# Patient Record
Sex: Male | Born: 1950 | Race: White | Hispanic: No | Marital: Single | Smoking: Current every day smoker
Health system: Southern US, Community
[De-identification: ages and names within clinical notes are randomized; demographics above are authoritative.]

## PROBLEM LIST (undated history)

## (undated) DIAGNOSIS — E785 Hyperlipidemia, unspecified: Secondary | ICD-10-CM

## (undated) DIAGNOSIS — F419 Anxiety disorder, unspecified: Secondary | ICD-10-CM

## (undated) HISTORY — PX: COLONOSCOPY W/ POLYPECTOMY: SHX1380

## (undated) HISTORY — DX: Anxiety disorder, unspecified: F41.9

## (undated) HISTORY — DX: Hyperlipidemia, unspecified: E78.5

## (undated) HISTORY — PX: WISDOM TOOTH EXTRACTION: SHX21

---

## 2008-06-22 ENCOUNTER — Emergency Department (HOSPITAL_COMMUNITY): Admission: EM | Admit: 2008-06-22 | Discharge: 2008-06-22 | Payer: Self-pay | Admitting: Emergency Medicine

## 2008-07-06 ENCOUNTER — Encounter: Admission: RE | Admit: 2008-07-06 | Discharge: 2008-07-06 | Payer: Self-pay | Admitting: Internal Medicine

## 2009-03-01 ENCOUNTER — Encounter (INDEPENDENT_AMBULATORY_CARE_PROVIDER_SITE_OTHER): Payer: Self-pay | Admitting: *Deleted

## 2009-03-29 ENCOUNTER — Encounter (INDEPENDENT_AMBULATORY_CARE_PROVIDER_SITE_OTHER): Payer: Self-pay | Admitting: *Deleted

## 2009-03-31 ENCOUNTER — Ambulatory Visit: Payer: Self-pay | Admitting: Gastroenterology

## 2009-04-14 ENCOUNTER — Ambulatory Visit: Payer: Self-pay | Admitting: Gastroenterology

## 2009-04-19 ENCOUNTER — Encounter: Payer: Self-pay | Admitting: Gastroenterology

## 2009-05-20 ENCOUNTER — Encounter: Admission: RE | Admit: 2009-05-20 | Discharge: 2009-05-20 | Payer: Self-pay | Admitting: Internal Medicine

## 2010-02-27 ENCOUNTER — Encounter: Payer: Self-pay | Admitting: Internal Medicine

## 2010-03-07 NOTE — Letter (Signed)
Summary: Patient Notice-Hyperplastic Polyps  [facility]  [address] [place], [place] [address]   Phone: [phone]  Fax: [phone]        April 19, 2009 MRN: [id number]    THELTON GRACA [address] [place], [place]  [address]    Dear Mr. SHULAR,  I am pleased to inform you that the colon polyp(s) removed during your recent colonoscopy was (were) found to be hyperplastic. These types of polyps are NOT pre-cancerous.  It is my recommendation that you have a repeat colonoscopy examination in 10 years for routine colorectal cancer screening.  Should you develop new or worsening symptoms of abdominal pain, bowel habit changes or bleeding from the rectum or bowels, please schedule an evaluation with either your primary care physician or with me.  Continue treatment plan as outlined the day of your exam.  Please call us if you are having persistent problems or have questions about your condition that have not been fully answered at this time.  Sincerely,  Meryl Dare MD [facility]  This letter has been electronically signed by your physician.  Appended Document: Patient Notice-Hyperplastic Polyps Letter mailed 3.18.11

## 2010-03-07 NOTE — Letter (Signed)
Summary: Previsit letter  [facility]  [address] [place], [place] [address]   Phone: [phone]  Fax: [phone]       03/01/2009 MRN: [id number]  Noah Allison [address] [place], [place]  [address]  Dear Mr. MORY,  Welcome to the Gastroenterology Division at [facility].    You are scheduled to see a nurse for your pre-procedure visit on March 31, 2009 at 8:30am on the 3rd floor at [facility], [address]  We ask that you try to arrive at our office 15 minutes prior to your appointment time to allow for check-in.  Your nurse visit will consist of discussing your medical and surgical history, your immediate family medical history, and your medications.    Please bring a complete list of all your medications or, if you prefer, bring the medication bottles and we will list them.  We will need to be aware of both prescribed and over the counter drugs.  We will need to know exact dosage information as well.  If you are on blood thinners (Coumadin, Plavix, Aggrenox, Ticlid, etc.) please call our office today/prior to your appointment, as we need to consult with your physician about holding your medication.   Please be prepared to read and sign documents such as consent forms, a financial agreement, and acknowledgement forms.  If necessary, and with your consent, a friend or relative is welcome to sit-in on the nurse visit with you.  Please bring your insurance card so that we may make a copy of it.  If your insurance requires a referral to see a specialist, please bring your referral form from your primary care physician.  No co-pay is required for this nurse visit.     If you cannot keep your appointment, please call [phone] to cancel or reschedule prior to your appointment date.  This allows [place] the opportunity to schedule an appointment for another patient in need of care.    Thank you for choosing [facility] for your  medical needs.  We appreciate the opportunity to care for you.  Please visit [place] at our website  to learn more about our practice.                     Sincerely.                                                                                                                   The Gastroenterology Division

## 2010-03-07 NOTE — Letter (Signed)
Summary: [facility] Instructions  [facility]  [address] [place], [place] [address]   Phone: [phone]  Fax: [phone]       Noah Allison    1950/09/06    MRN: [id number]        Procedure Day /Date:  Thursday  04/14/09     Arrival Time:  7:30am     Procedure Time:  8:30am     Location of Procedure:                    _X _  [facility] (4th Floor)                        PREPARATION FOR COLONOSCOPY WITH MOVIPREP   Starting 5 days prior to your procedure   Saturday 03/05   do not eat nuts, seeds, popcorn, corn, beans, peas,  salads, or any raw vegetables.  Do not take any fiber supplements (e.g. Metamucil, Citrucel, and Benefiber).  THE DAY BEFORE YOUR PROCEDURE         DATE:   03/09  DAY:   Wednesday  1.  Drink clear liquids the entire day-NO SOLID FOOD  2.  Do not drink anything colored red or purple.  Avoid juices with pulp.  No orange juice.  3.  Drink at least 64 oz. (8 glasses) of fluid/clear liquids during the day to prevent dehydration and help the prep work efficiently.  CLEAR LIQUIDS INCLUDE: Water Jello Ice Popsicles Tea (sugar ok, no milk/cream) Powdered fruit flavored drinks Coffee (sugar ok, no milk/cream) Gatorade Juice: apple, white grape, white cranberry  Lemonade Clear bullion, consomm, broth Carbonated beverages (any kind) Strained chicken noodle soup Hard Candy                             4.  In the morning, mix first dose of MoviPrep solution:    Empty 1 Pouch A and 1 Pouch B into the disposable container    Add lukewarm drinking water to the top line of the container. Mix to dissolve    Refrigerate (mixed solution should be used within 24 hrs)  5.  Begin drinking the prep at 5:00 p.m. The MoviPrep container is divided by 4 marks.   Every 15 minutes drink the solution down to the next mark (approximately 8 oz) until the full liter is complete.   6.  Follow completed prep with 16 oz of clear liquid of  your choice (Nothing red or purple).  Continue to drink clear liquids until bedtime.  7.  Before going to bed, mix second dose of MoviPrep solution:    Empty 1 Pouch A and 1 Pouch B into the disposable container    Add lukewarm drinking water to the top line of the container. Mix to dissolve    Refrigerate  THE DAY OF YOUR PROCEDURE      DATE:   03/10  DAY:  Thursday  Beginning at  3:30 a.m. (5 hours before procedure):         1. Every 15 minutes, drink the solution down to the next mark (approx 8 oz) until the full liter is complete.  2. Follow completed prep with 16 oz. of clear liquid of your choice.    3. You may drink clear liquids until 6:30am  (2 HOURS BEFORE PROCEDURE).   MEDICATION INSTRUCTIONS  Unless otherwise instructed, you should take regular prescription  medications with a small sip of water   as early as possible the morning of your procedure.         OTHER INSTRUCTIONS  You will need a responsible adult at least 58 years of age to accompany you and drive you home.   This person must remain in the waiting room during your procedure.  Wear loose fitting clothing that is easily removed.  Leave jewelry and other valuables at home.  However, you may wish to bring a book to read or  an iPod/MP3 player to listen to music as you wait for your procedure to start.  Remove all body piercing jewelry and leave at home.  Total time from sign-in until discharge is approximately 2-3 hours.  You should go home directly after your procedure and rest.  You can resume normal activities the  day after your procedure.  The day of your procedure you should not:   Drive   Make legal decisions   Operate machinery   Drink alcohol   Return to work  You will receive specific instructions about eating, activities and medications before you leave.    The above instructions have been reviewed and explained to me by   Wyona Almas RN  March 31, 2009 8:50  AM     I fully understand and can verbalize these instructions _____________________________ Date _________

## 2010-03-07 NOTE — Miscellaneous (Signed)
Summary: [facility] Previsit/prep  Clinical Lists Changes  Medications: Added new medication of MOVIPREP 100 GM  SOLR (PEG-KCL-NACL-NASULF-NA ASC-C) As per prep instructions. - Signed Rx of MOVIPREP 100 GM  SOLR (PEG-KCL-NACL-NASULF-NA ASC-C) As per prep instructions.;  #1 x 0;  Signed;  Entered by: Wyona Almas RN;  Authorized by: Meryl Dare MD [facility];  Method used: Electronically to [facility] [id number]*, [address], [place], [place]  [address], Ph: [phone] or [phone], Fax: [phone] Observations: Added new observation of NKA: T (03/31/2009 8:09)    Prescriptions: MOVIPREP 100 GM  SOLR (PEG-KCL-NACL-NASULF-NA ASC-C) As per prep instructions.  #1 x 0   Entered by:   Wyona Almas RN   Authorized by:   Meryl Dare MD [facility]   Signed by:   Wyona Almas RN on 03/31/2009   Method used:   Electronically to        [facility] [phone]* (retail)       [address]       [place], [place]  [address]       Ph: [phone] or [phone]       Fax: [phone]   RxID:   [id number]

## 2010-03-07 NOTE — Procedures (Signed)
Summary: Colonoscopy  Patient: Noah Allison Note: All result statuses are Final unless otherwise noted.  Tests: (1) Colonoscopy (COL)   COL Colonoscopy           DONE     [facility]     [address]     [place], [place]  [address]           COLONOSCOPY PROCEDURE REPORT           PATIENT:  Noah Allison, Noah Allison  MR#:  [id number]     BIRTHDATE:  Jun 06, 1950, 58 yrs. old  GENDER:  male           ENDOSCOPIST:  Judie Petit T. Russella Dar, MD, [facility]     Referred by:  Pearson Grippe, M.D.           PROCEDURE DATE:  04/14/2009     PROCEDURE:  Colonoscopy with snare polypectomy     ASA CLASS:  Class II     INDICATIONS:  1) Routine Risk Screening           MEDICATIONS:   Fentanyl 75 mcg IV, Versed 7 mg IV           DESCRIPTION OF PROCEDURE:   After the risks benefits and     alternatives of the procedure were thoroughly explained, informed     consent was obtained.  Digital rectal exam was performed and     revealed no abnormalities.   The LB [id number] endoscope     was introduced through the anus and advanced to the cecum, which     was identified by both the appendix and ileocecal valve, without     limitations.  The quality of the prep was excellent, using     MoviPrep.  The instrument was then slowly withdrawn as the colon     was fully examined.     <<PROCEDUREIMAGES>>           FINDINGS:  A pedunculated polyp was found in the descending colon.     It was 7 mm in size. Polyp was snared, then cauterized with     monopolar cautery. Retrieval was successful. A sessile polyp was     found in the rectum. It was 4 mm in size. The polyp was removed     using cold biopsy forceps.  Moderate diverticulosis was found in     the sigmoid colon. This was otherwise a normal examination of the     colon. Retroflexed views in the rectum revealed internal     hemorrhoids, moderate. The time to cecum =  5.67  minutes. The     scope was then withdrawn (time =  8.67  min) from the patient and  the procedure completed.           COMPLICATIONS:  None           ENDOSCOPIC IMPRESSION:     1) 7 mm pedunculated polyp in the descending colon     2) 4 mm sessile polyp in the rectum     3) Moderate diverticulosis in the sigmoid colon     4) Internal hemorrhoids           RECOMMENDATIONS:     1) Await pathology results     2) No aspirin or NSAID's for 2 weeks     3) High fiber diet with liberal fluid intake.     4) If the polyps removed today are adenomatous (pre-cancerous),     you  will need a repeat colonoscopy in 5 years. Otherwise you     should continue to follow colorectal cancer screening guidelines     for "routine risk" patients with colonoscopy in 10 years.     Venita Lick. Russella Dar, MD, [facility]           n.     eSIGNED:   Venita Lick. Jacoria Keiffer at 04/14/2009 09:01 AM           Regan Rakers, [id number]  Note: An exclamation mark (!) indicates a result that was not dispersed into the flowsheet. Document Creation Date: 04/14/2009 9:01 AM _______________________________________________________________________  (1) Order result status: Final Collection or observation date-time: 04/14/2009 08:56 Requested date-time:  Receipt date-time:  Reported date-time:  Referring Physician:   Ordering Physician: Claudette Head [phone]) Specimen Source:  Source: Launa Grill Order Number: [id number] Lab site:   Appended Document: Colonoscopy     Procedures Next Due Date:    Colonoscopy: 04/2019

## 2010-08-01 ENCOUNTER — Other Ambulatory Visit: Payer: Self-pay | Admitting: Internal Medicine

## 2010-08-01 DIAGNOSIS — I6529 Occlusion and stenosis of unspecified carotid artery: Secondary | ICD-10-CM

## 2010-08-04 ENCOUNTER — Ambulatory Visit
Admission: RE | Admit: 2010-08-04 | Discharge: 2010-08-04 | Disposition: A | Discharge status: Home / Self Care | Payer: BC Managed Care – PPO | Source: Ambulatory Visit | Attending: Internal Medicine | Admitting: Internal Medicine

## 2010-08-04 DIAGNOSIS — I6529 Occlusion and stenosis of unspecified carotid artery: Secondary | ICD-10-CM

## 2013-05-25 ENCOUNTER — Other Ambulatory Visit: Payer: Self-pay | Admitting: Dermatology

## 2014-06-16 ENCOUNTER — Encounter: Payer: Self-pay | Admitting: Gastroenterology

## 2014-08-12 ENCOUNTER — Ambulatory Visit (AMBULATORY_SURGERY_CENTER): Payer: Self-pay

## 2014-08-12 VITALS — Ht 67.5 in | Wt 152.6 lb

## 2014-08-12 DIAGNOSIS — Z860100 Personal history of colon polyps, unspecified: Secondary | ICD-10-CM

## 2014-08-12 MED ORDER — SUPREP BOWEL PREP KIT 17.5-3.13-1.6 GM/177ML PO SOLN: 1.0000 | Freq: Once | ORAL | Status: DC

## 2014-08-12 NOTE — Progress Notes (Signed)
No allergies to eggs or soy No diet/weight loss meds No past problems with anesthesia No home oxygen  Does not want emmi instructions; has had procedure before.

## 2014-08-20 ENCOUNTER — Encounter: Payer: Self-pay | Admitting: Gastroenterology

## 2014-08-20 ENCOUNTER — Ambulatory Visit (AMBULATORY_SURGERY_CENTER): Payer: 59 | Admitting: Gastroenterology

## 2014-08-20 VITALS — BP 112/72 | HR 55 | Temp 97.1°F | Resp 22 | Ht 67.5 in | Wt 152.0 lb

## 2014-08-20 DIAGNOSIS — Z1211 Encounter for screening for malignant neoplasm of colon: Secondary | ICD-10-CM

## 2014-08-20 DIAGNOSIS — Z800 Family history of malignant neoplasm of digestive organs: Secondary | ICD-10-CM | POA: Diagnosis present

## 2014-08-20 MED ORDER — SODIUM CHLORIDE 0.9 % IV SOLN: 500.0000 mL | INTRAVENOUS | Status: DC

## 2014-08-20 NOTE — Patient Instructions (Signed)
YOU HAD AN ENDOSCOPIC PROCEDURE TODAY AT [facility]:   Refer to the procedure report that was given to you for any specific questions about what was found during the examination.  If the procedure report does not answer your questions, please call your gastroenterologist to clarify.  If you requested that your care partner not be given the details of your procedure findings, then the procedure report has been included in a sealed envelope for you to review at your convenience later.  YOU SHOULD EXPECT: Some feelings of bloating in the abdomen. Passage of more gas than usual.  Walking can help get rid of the air that was put into your GI tract during the procedure and reduce the bloating. If you had a lower endoscopy (such as a colonoscopy or flexible sigmoidoscopy) you may notice spotting of blood in your stool or on the toilet paper. If you underwent a bowel prep for your procedure, you may not have a normal bowel movement for a few days.  Please Note:  You might notice some irritation and congestion in your nose or some drainage.  This is from the oxygen used during your procedure.  There is no need for concern and it should clear up in a day or so.  SYMPTOMS TO REPORT IMMEDIATELY:   Following lower endoscopy (colonoscopy or flexible sigmoidoscopy):  Excessive amounts of blood in the stool  Significant tenderness or worsening of abdominal pains  Swelling of the abdomen that is new, acute  Fever of 100F or higher  For urgent or emergent issues, a gastroenterologist can be reached at any hour by calling [phone].   DIET: Your first meal following the procedure should be a small meal and then it is ok to progress to your normal diet. Heavy or fried foods are harder to digest and may make you feel nauseous or bloated.  Likewise, meals heavy in dairy and vegetables can increase bloating.  Drink plenty of fluids but you should avoid alcoholic beverages for 24  hours.  ACTIVITY:  You should plan to take it easy for the rest of today and you should NOT DRIVE or use heavy machinery until tomorrow (because of the sedation medicines used during the test).    FOLLOW UP: Our staff will call the number listed on your records the next business day following your procedure to check on you and address any questions or concerns that you may have regarding the information given to you following your procedure. If we do not reach you, we will leave a message.  However, if you are feeling well and you are not experiencing any problems, there is no need to return our call.  We will assume that you have returned to your regular daily activities without incident.  If any biopsies were taken you will be contacted by phone or by letter within the next 1-3 weeks.  Please call us at [phone] if you have not heard about the biopsies in 3 weeks.    SIGNATURES/CONFIDENTIALITY: You and/or your care partner have signed paperwork which will be entered into your electronic medical record.  These signatures attest to the fact that that the information above on your After Visit Summary has been reviewed and is understood.  Full responsibility of the confidentiality of this discharge information lies with you and/or your care-partner.  Diverticulosis and high fiber diet information given. Liberal fluid intake.  Follow-up colonoscopy 5 years.

## 2014-08-20 NOTE — Op Note (Addendum)
[place]  [address] [place], [address]   COLONOSCOPY PROCEDURE REPORT  PATIENT: Noah Allison, Noah Allison  MR#: [id number] BIRTHDATE: 07-14-1950 , 13  yrs. old GENDER: male ENDOSCOPIST: Ladene Artist, MD, [facility] REFERRED NI:OEVOJ Kim, M.D. PROCEDURE DATE:  08/20/2014 PROCEDURE:   Colonoscopy, screening First Screening Colonoscopy - Avg.  risk and is 50 yrs.  old or older - No.  Prior Negative Screening - Now for repeat screening. 10 or more years since last screening  History of Adenoma - Now for follow-up colonoscopy & has been > or = to 3 yrs.  N/A  Polyps removed today? No Recommend repeat exam, <10 yrs? No ASA CLASS:   Class II INDICATIONS:Screening for colonic neoplasia and Colorectal Neoplasm Risk Assessment for this procedure is high. 2 paternal uncles and PGF with colon cancer MEDICATIONS: Monitored anesthesia care and Propofol 200 mg IV DESCRIPTION OF PROCEDURE:   After the risks benefits and alternatives of the procedure were thoroughly explained, informed consent was obtained.  The digital rectal exam revealed no abnormalities of the rectum.   The LB [id number]  endoscope was introduced through the anus and advanced to the cecum, which was identified by both the appendix and ileocecal valve. No adverse events experienced.   The quality of the prep was excellent. (Suprep was used)  The instrument was then slowly withdrawn as the colon was fully examined. Estimated blood loss is zero unless otherwise noted in this procedure report.    COLON FINDINGS: There was moderate diverticulosis noted in the sigmoid colon with associated luminal narrowing, spasm and muscular hypertrophy.   The examination was otherwise normal.  Retroflexed views revealed internal Grade I hemorrhoids. The time to cecum = 8.7 Withdrawal time = 10.5   The scope was withdrawn and the procedure completed. COMPLICATIONS: There were no immediate complications.  ENDOSCOPIC  IMPRESSION: 1.   Moderate diverticulosis in the sigmoid colon 2.   Grade l internal hemorrhoids  RECOMMENDATIONS: 1.  High fiber diet with liberal fluid intake. 2.  Repeat colonoscopy in 5 years.  eSigned:  Ladene Artist, MD, [facility] 08/20/2014 11:38 AM Revised: 08/20/2014 11:38 AM [C

## 2014-08-20 NOTE — Progress Notes (Signed)
Report to PACU, RN, vss, BBS= Clear.  

## 2014-08-23 ENCOUNTER — Telehealth: Payer: Self-pay

## 2014-08-23 NOTE — Telephone Encounter (Signed)
  Follow up Call-  Call back number 08/20/2014  Post procedure Call Back phone  # [phone]  Permission to leave phone message No  comments no voicemail     Patient questions:  Do you have a fever, pain , or abdominal swelling? No. Pain Score  0 *  Have you tolerated food without any problems? Yes.    Have you been able to return to your normal activities? Yes.    Do you have any questions about your discharge instructions: Diet   No. Medications  No. Follow up visit  No.  Do you have questions or concerns about your Care? No.  Actions: * If pain score is 4 or above: No action needed, pain <4.  No problems per the pt. maw

## 2014-09-30 ENCOUNTER — Other Ambulatory Visit: Payer: Self-pay | Admitting: Physician Assistant

## 2014-10-28 ENCOUNTER — Encounter: Payer: Self-pay | Admitting: Gastroenterology

## 2015-06-08 ENCOUNTER — Other Ambulatory Visit: Payer: Self-pay | Admitting: Internal Medicine

## 2015-06-08 DIAGNOSIS — I6521 Occlusion and stenosis of right carotid artery: Secondary | ICD-10-CM

## 2015-11-09 ENCOUNTER — Other Ambulatory Visit: Payer: Self-pay

## 2015-11-10 ENCOUNTER — Ambulatory Visit
Admission: RE | Admit: 2015-11-10 | Discharge: 2015-11-10 | Disposition: A | Discharge status: Home / Self Care | Payer: BLUE CROSS/BLUE SHIELD | Source: Ambulatory Visit | Attending: Internal Medicine | Admitting: Internal Medicine

## 2015-11-10 DIAGNOSIS — I6521 Occlusion and stenosis of right carotid artery: Secondary | ICD-10-CM

## 2016-05-08 ENCOUNTER — Other Ambulatory Visit: Payer: Self-pay | Admitting: Internal Medicine

## 2016-05-08 DIAGNOSIS — R519 Headache, unspecified: Secondary | ICD-10-CM

## 2016-05-08 DIAGNOSIS — R51 Headache: Secondary | ICD-10-CM

## 2016-05-08 DIAGNOSIS — R05 Cough: Secondary | ICD-10-CM

## 2016-05-08 DIAGNOSIS — R059 Cough, unspecified: Secondary | ICD-10-CM

## 2016-05-08 DIAGNOSIS — F17200 Nicotine dependence, unspecified, uncomplicated: Secondary | ICD-10-CM

## 2016-05-09 ENCOUNTER — Other Ambulatory Visit: Payer: Self-pay | Admitting: Internal Medicine

## 2016-05-09 DIAGNOSIS — F17200 Nicotine dependence, unspecified, uncomplicated: Secondary | ICD-10-CM

## 2016-05-10 ENCOUNTER — Other Ambulatory Visit: Payer: BLUE CROSS/BLUE SHIELD

## 2016-05-11 ENCOUNTER — Ambulatory Visit
Admission: RE | Admit: 2016-05-11 | Discharge: 2016-05-11 | Disposition: A | Discharge status: Home / Self Care | Payer: BLUE CROSS/BLUE SHIELD | Source: Ambulatory Visit | Attending: Internal Medicine | Admitting: Internal Medicine

## 2016-05-11 DIAGNOSIS — R51 Headache: Principal | ICD-10-CM

## 2016-05-11 DIAGNOSIS — F17200 Nicotine dependence, unspecified, uncomplicated: Secondary | ICD-10-CM

## 2016-05-11 DIAGNOSIS — R519 Headache, unspecified: Secondary | ICD-10-CM

## 2018-07-01 DIAGNOSIS — E7800 Pure hypercholesterolemia, unspecified: Secondary | ICD-10-CM | POA: Diagnosis not present

## 2018-07-01 DIAGNOSIS — Z0000 Encounter for general adult medical examination without abnormal findings: Secondary | ICD-10-CM | POA: Diagnosis not present

## 2018-07-01 DIAGNOSIS — Z125 Encounter for screening for malignant neoplasm of prostate: Secondary | ICD-10-CM | POA: Diagnosis not present

## 2018-07-01 DIAGNOSIS — E039 Hypothyroidism, unspecified: Secondary | ICD-10-CM | POA: Diagnosis not present

## 2018-07-11 DIAGNOSIS — R05 Cough: Secondary | ICD-10-CM | POA: Diagnosis not present

## 2018-07-11 DIAGNOSIS — B079 Viral wart, unspecified: Secondary | ICD-10-CM | POA: Diagnosis not present

## 2018-07-11 DIAGNOSIS — E039 Hypothyroidism, unspecified: Secondary | ICD-10-CM | POA: Diagnosis not present

## 2018-07-11 DIAGNOSIS — I10 Essential (primary) hypertension: Secondary | ICD-10-CM | POA: Diagnosis not present

## 2018-07-11 DIAGNOSIS — M5412 Radiculopathy, cervical region: Secondary | ICD-10-CM | POA: Diagnosis not present

## 2018-07-11 DIAGNOSIS — I6529 Occlusion and stenosis of unspecified carotid artery: Secondary | ICD-10-CM | POA: Diagnosis not present

## 2018-07-11 DIAGNOSIS — Z125 Encounter for screening for malignant neoplasm of prostate: Secondary | ICD-10-CM | POA: Diagnosis not present

## 2018-07-11 DIAGNOSIS — E7800 Pure hypercholesterolemia, unspecified: Secondary | ICD-10-CM | POA: Diagnosis not present

## 2018-07-11 DIAGNOSIS — F17200 Nicotine dependence, unspecified, uncomplicated: Secondary | ICD-10-CM | POA: Diagnosis not present

## 2018-07-11 DIAGNOSIS — Z0000 Encounter for general adult medical examination without abnormal findings: Secondary | ICD-10-CM | POA: Diagnosis not present

## 2018-10-23 DIAGNOSIS — L821 Other seborrheic keratosis: Secondary | ICD-10-CM | POA: Diagnosis not present

## 2018-10-23 DIAGNOSIS — C44622 Squamous cell carcinoma of skin of right upper limb, including shoulder: Secondary | ICD-10-CM | POA: Diagnosis not present

## 2018-10-23 DIAGNOSIS — L84 Corns and callosities: Secondary | ICD-10-CM | POA: Diagnosis not present

## 2019-01-07 DIAGNOSIS — I10 Essential (primary) hypertension: Secondary | ICD-10-CM | POA: Diagnosis not present

## 2019-01-07 DIAGNOSIS — E7800 Pure hypercholesterolemia, unspecified: Secondary | ICD-10-CM | POA: Diagnosis not present

## 2019-01-14 DIAGNOSIS — Z122 Encounter for screening for malignant neoplasm of respiratory organs: Secondary | ICD-10-CM | POA: Diagnosis not present

## 2019-01-14 DIAGNOSIS — M5135 Other intervertebral disc degeneration, thoracolumbar region: Secondary | ICD-10-CM | POA: Diagnosis not present

## 2019-01-14 DIAGNOSIS — I6521 Occlusion and stenosis of right carotid artery: Secondary | ICD-10-CM | POA: Diagnosis not present

## 2019-01-14 DIAGNOSIS — F17200 Nicotine dependence, unspecified, uncomplicated: Secondary | ICD-10-CM | POA: Diagnosis not present

## 2019-01-14 DIAGNOSIS — M419 Scoliosis, unspecified: Secondary | ICD-10-CM | POA: Diagnosis not present

## 2019-01-14 DIAGNOSIS — Z125 Encounter for screening for malignant neoplasm of prostate: Secondary | ICD-10-CM | POA: Diagnosis not present

## 2019-03-02 ENCOUNTER — Other Ambulatory Visit: Payer: Self-pay | Admitting: Family Medicine

## 2019-03-02 DIAGNOSIS — Z122 Encounter for screening for malignant neoplasm of respiratory organs: Secondary | ICD-10-CM

## 2019-03-09 ENCOUNTER — Encounter (INDEPENDENT_AMBULATORY_CARE_PROVIDER_SITE_OTHER): Payer: Self-pay

## 2019-03-09 ENCOUNTER — Ambulatory Visit
Admission: RE | Admit: 2019-03-09 | Discharge: 2019-03-09 | Disposition: A | Discharge status: Home / Self Care | Payer: PPO | Source: Ambulatory Visit | Attending: Family Medicine | Admitting: Family Medicine

## 2019-03-09 DIAGNOSIS — F17210 Nicotine dependence, cigarettes, uncomplicated: Secondary | ICD-10-CM | POA: Diagnosis not present

## 2019-03-09 DIAGNOSIS — Z122 Encounter for screening for malignant neoplasm of respiratory organs: Secondary | ICD-10-CM

## 2019-07-07 DIAGNOSIS — I6521 Occlusion and stenosis of right carotid artery: Secondary | ICD-10-CM | POA: Diagnosis not present

## 2019-07-07 DIAGNOSIS — Z125 Encounter for screening for malignant neoplasm of prostate: Secondary | ICD-10-CM | POA: Diagnosis not present

## 2020-02-23 DIAGNOSIS — L821 Other seborrheic keratosis: Secondary | ICD-10-CM | POA: Diagnosis not present

## 2020-02-23 DIAGNOSIS — Z859 Personal history of malignant neoplasm, unspecified: Secondary | ICD-10-CM | POA: Diagnosis not present

## 2020-02-23 DIAGNOSIS — L905 Scar conditions and fibrosis of skin: Secondary | ICD-10-CM | POA: Diagnosis not present

## 2020-09-13 DIAGNOSIS — C44319 Basal cell carcinoma of skin of other parts of face: Secondary | ICD-10-CM | POA: Diagnosis not present

## 2020-09-13 DIAGNOSIS — L821 Other seborrheic keratosis: Secondary | ICD-10-CM | POA: Diagnosis not present

## 2020-09-13 DIAGNOSIS — Z859 Personal history of malignant neoplasm, unspecified: Secondary | ICD-10-CM | POA: Diagnosis not present

## 2020-09-13 DIAGNOSIS — L905 Scar conditions and fibrosis of skin: Secondary | ICD-10-CM | POA: Diagnosis not present

## 2020-09-13 DIAGNOSIS — L570 Actinic keratosis: Secondary | ICD-10-CM | POA: Diagnosis not present

## 2020-10-17 DIAGNOSIS — E039 Hypothyroidism, unspecified: Secondary | ICD-10-CM | POA: Diagnosis not present

## 2020-10-17 DIAGNOSIS — Z125 Encounter for screening for malignant neoplasm of prostate: Secondary | ICD-10-CM | POA: Diagnosis not present

## 2020-10-17 DIAGNOSIS — R39198 Other difficulties with micturition: Secondary | ICD-10-CM | POA: Diagnosis not present

## 2020-10-17 DIAGNOSIS — E7800 Pure hypercholesterolemia, unspecified: Secondary | ICD-10-CM | POA: Diagnosis not present

## 2020-10-17 DIAGNOSIS — I10 Essential (primary) hypertension: Secondary | ICD-10-CM | POA: Diagnosis not present

## 2020-10-24 ENCOUNTER — Other Ambulatory Visit: Payer: Self-pay | Admitting: Internal Medicine

## 2020-10-24 DIAGNOSIS — I700 Atherosclerosis of aorta: Secondary | ICD-10-CM | POA: Diagnosis not present

## 2020-10-24 DIAGNOSIS — D692 Other nonthrombocytopenic purpura: Secondary | ICD-10-CM | POA: Diagnosis not present

## 2020-10-24 DIAGNOSIS — E782 Mixed hyperlipidemia: Secondary | ICD-10-CM | POA: Diagnosis not present

## 2020-10-24 DIAGNOSIS — F17200 Nicotine dependence, unspecified, uncomplicated: Secondary | ICD-10-CM

## 2020-10-24 DIAGNOSIS — I10 Essential (primary) hypertension: Secondary | ICD-10-CM | POA: Diagnosis not present

## 2020-10-24 DIAGNOSIS — Z0000 Encounter for general adult medical examination without abnormal findings: Secondary | ICD-10-CM | POA: Diagnosis not present

## 2020-10-24 DIAGNOSIS — E039 Hypothyroidism, unspecified: Secondary | ICD-10-CM | POA: Diagnosis not present

## 2020-10-24 DIAGNOSIS — Z23 Encounter for immunization: Secondary | ICD-10-CM | POA: Diagnosis not present

## 2020-10-24 DIAGNOSIS — I6521 Occlusion and stenosis of right carotid artery: Secondary | ICD-10-CM | POA: Diagnosis not present

## 2020-11-03 DIAGNOSIS — Z136 Encounter for screening for cardiovascular disorders: Secondary | ICD-10-CM | POA: Diagnosis not present

## 2020-11-03 DIAGNOSIS — F17200 Nicotine dependence, unspecified, uncomplicated: Secondary | ICD-10-CM | POA: Diagnosis not present

## 2020-11-03 DIAGNOSIS — I6521 Occlusion and stenosis of right carotid artery: Secondary | ICD-10-CM | POA: Diagnosis not present

## 2020-11-03 DIAGNOSIS — I6522 Occlusion and stenosis of left carotid artery: Secondary | ICD-10-CM | POA: Diagnosis not present

## 2020-11-05 ENCOUNTER — Ambulatory Visit
Admission: RE | Admit: 2020-11-05 | Discharge: 2020-11-05 | Disposition: A | Discharge status: Home / Self Care | Payer: PPO | Source: Ambulatory Visit | Attending: Internal Medicine | Admitting: Internal Medicine

## 2020-11-05 ENCOUNTER — Other Ambulatory Visit: Payer: Self-pay

## 2020-11-05 DIAGNOSIS — F17200 Nicotine dependence, unspecified, uncomplicated: Secondary | ICD-10-CM

## 2020-11-05 DIAGNOSIS — F17210 Nicotine dependence, cigarettes, uncomplicated: Secondary | ICD-10-CM | POA: Diagnosis not present

## 2020-11-14 DIAGNOSIS — C44319 Basal cell carcinoma of skin of other parts of face: Secondary | ICD-10-CM | POA: Diagnosis not present

## 2020-11-21 DIAGNOSIS — L905 Scar conditions and fibrosis of skin: Secondary | ICD-10-CM | POA: Diagnosis not present

## 2020-12-05 DIAGNOSIS — I10 Essential (primary) hypertension: Secondary | ICD-10-CM | POA: Diagnosis not present

## 2020-12-05 DIAGNOSIS — E782 Mixed hyperlipidemia: Secondary | ICD-10-CM | POA: Diagnosis not present

## 2020-12-05 DIAGNOSIS — I6521 Occlusion and stenosis of right carotid artery: Secondary | ICD-10-CM | POA: Diagnosis not present

## 2020-12-12 DIAGNOSIS — I10 Essential (primary) hypertension: Secondary | ICD-10-CM | POA: Diagnosis not present

## 2020-12-12 DIAGNOSIS — E782 Mixed hyperlipidemia: Secondary | ICD-10-CM | POA: Diagnosis not present

## 2020-12-12 DIAGNOSIS — I700 Atherosclerosis of aorta: Secondary | ICD-10-CM | POA: Diagnosis not present

## 2020-12-12 DIAGNOSIS — D692 Other nonthrombocytopenic purpura: Secondary | ICD-10-CM | POA: Diagnosis not present

## 2021-11-06 ENCOUNTER — Other Ambulatory Visit: Payer: Self-pay | Admitting: Internal Medicine

## 2021-11-06 DIAGNOSIS — F17200 Nicotine dependence, unspecified, uncomplicated: Secondary | ICD-10-CM

## 2021-12-01 ENCOUNTER — Ambulatory Visit
Admission: RE | Admit: 2021-12-01 | Discharge: 2021-12-01 | Disposition: A | Discharge status: Home / Self Care | Payer: PPO | Source: Ambulatory Visit | Attending: Internal Medicine | Admitting: Internal Medicine

## 2021-12-01 DIAGNOSIS — F17200 Nicotine dependence, unspecified, uncomplicated: Secondary | ICD-10-CM

## 2022-06-03 IMAGING — CT CT CHEST LUNG CANCER SCREENING LOW DOSE W/O CM
1 series · 14 of 33 positions shown, 18 images · non-contrast
Comparison: Low-dose lung cancer screening chest CT 03/09/2019.

CLINICAL DATA: 69-year-old male current smoker with 49 pack-year
history of smoking. Lung cancer screening examination.

EXAM:
CT CHEST WITHOUT CONTRAST LOW-DOSE FOR LUNG CANCER SCREENING
TECHNIQUE: Multidetector CT imaging of the chest was performed following the
standard protocol without IV contrast.

[Series 2: ldct screening <30 bmi · axial · 0.70mm/px · z∈[-350,-75]mm · 14 of 65 slices shown, 18 images]
[im 5/65  mediastinal]
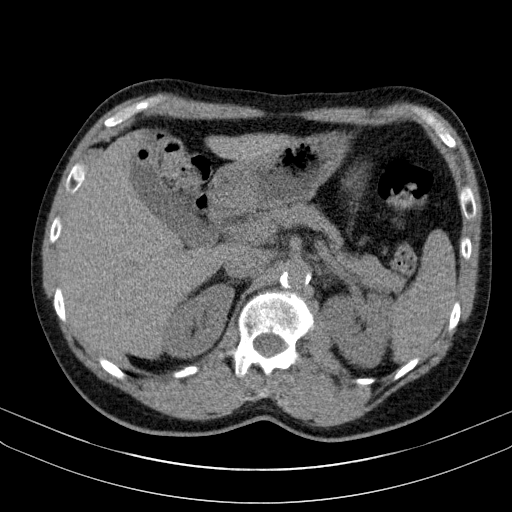
[im 5/65  lung]
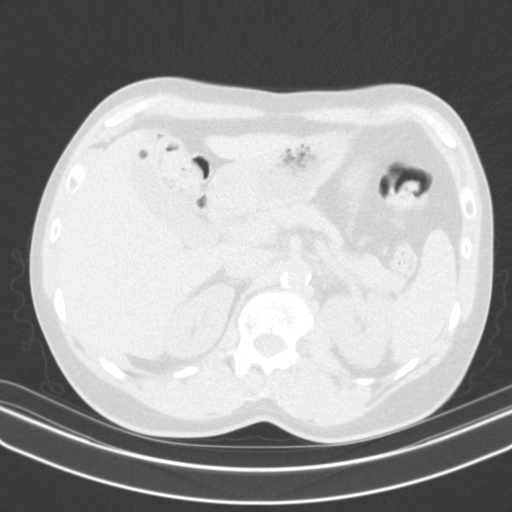
[im 10/65  lung]
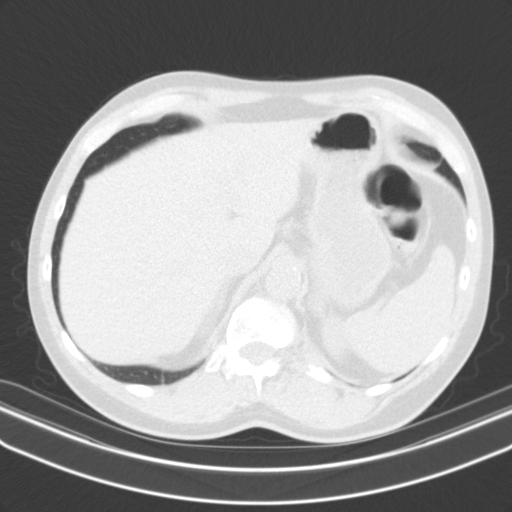
[im 13/65  lung]
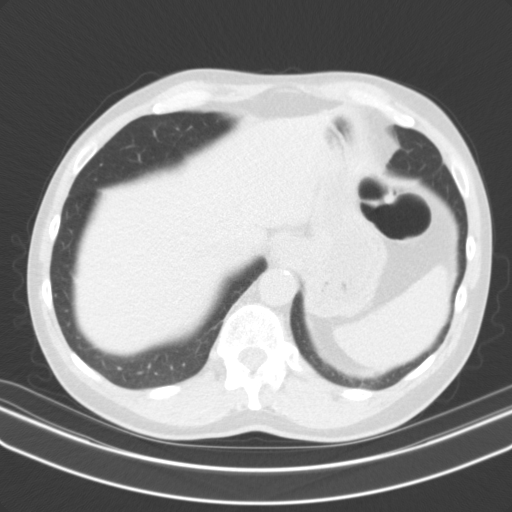
[im 17/65  lung]
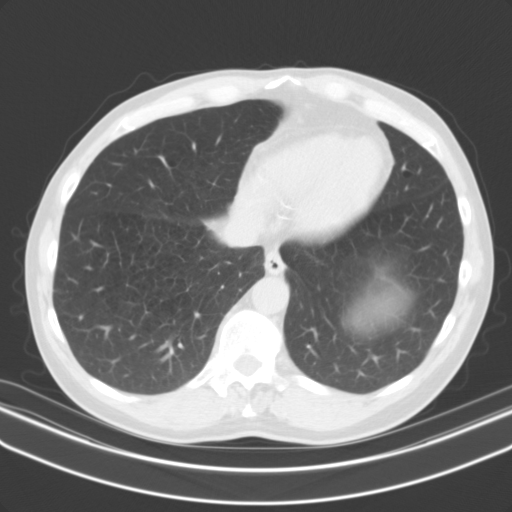
[im 22/65  mediastinal]
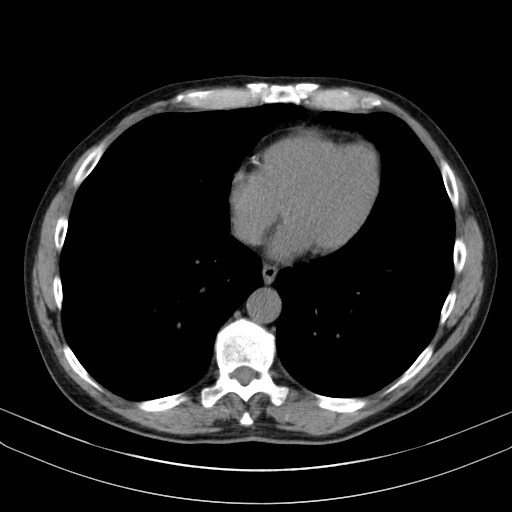
[im 22/65  lung]
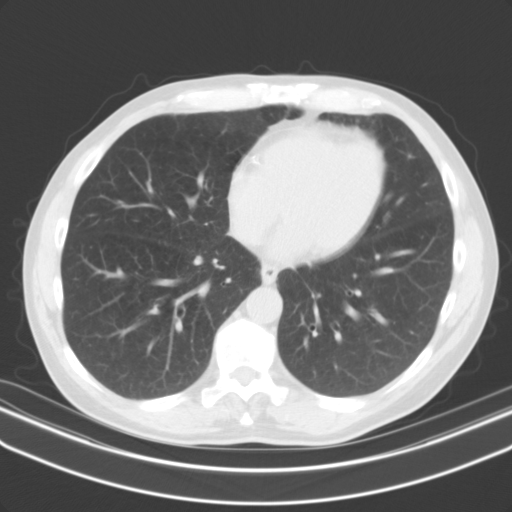
[im 27/65  lung]
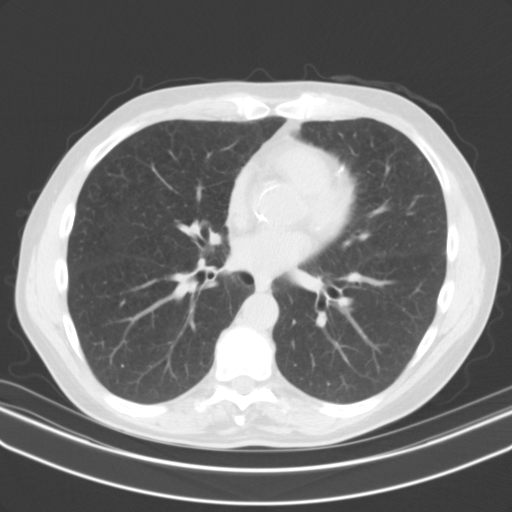
[im 31/65  lung]
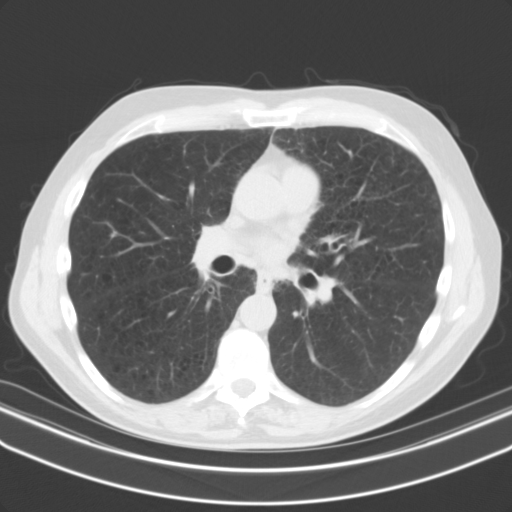
[im 35/65  lung]
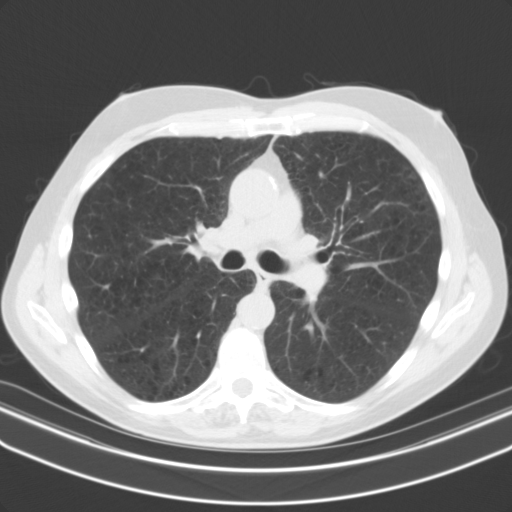
[im 38/65  mediastinal]
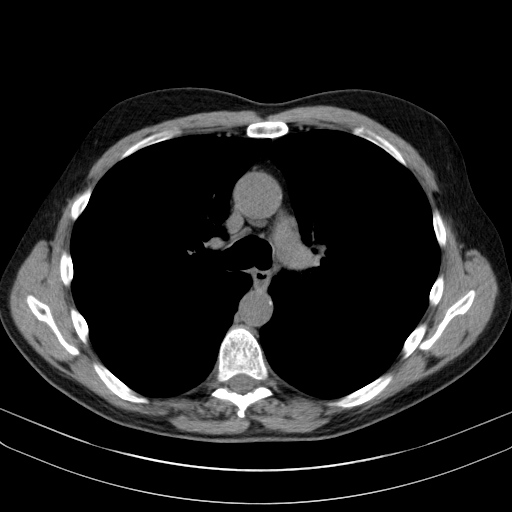
[im 38/65  lung]
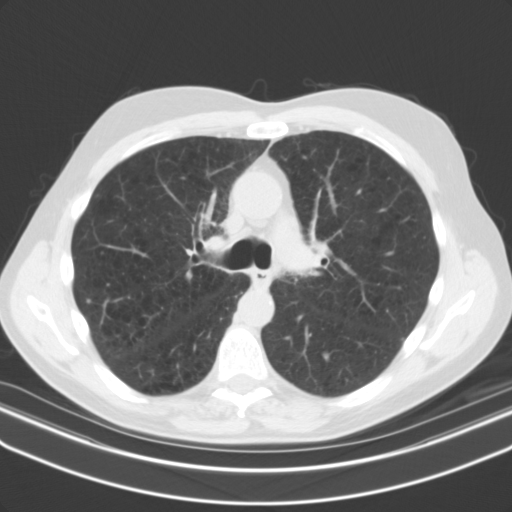
[im 43/65  lung]
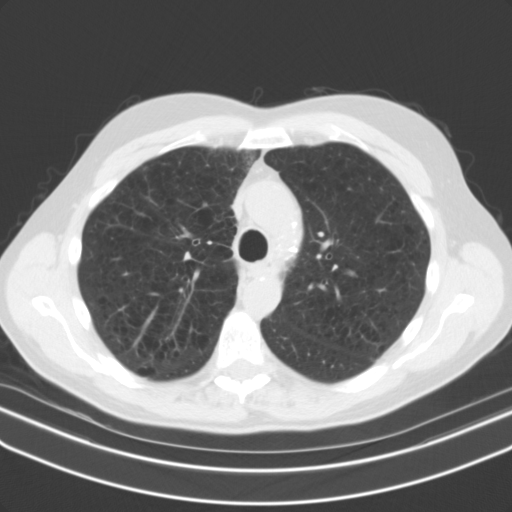
[im 48/65  lung]
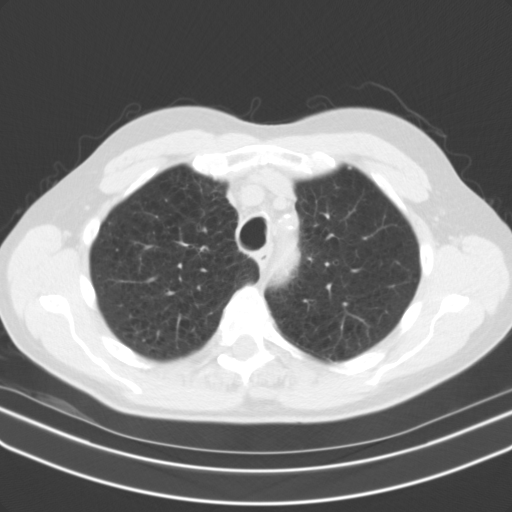
[im 52/65  lung]
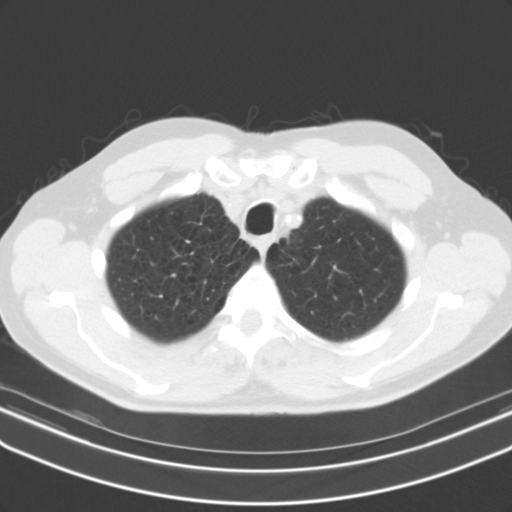
[im 55/65  mediastinal]
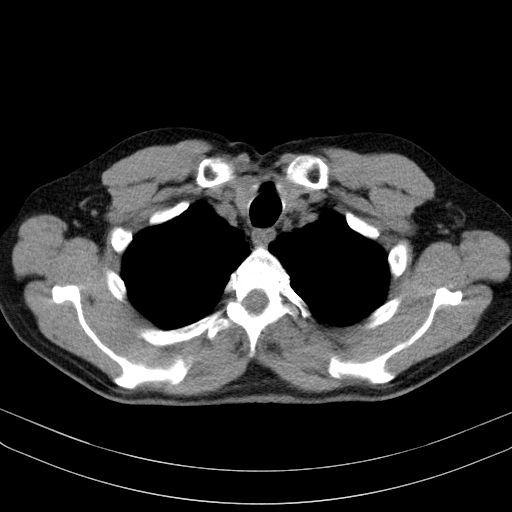
[im 55/65  lung]
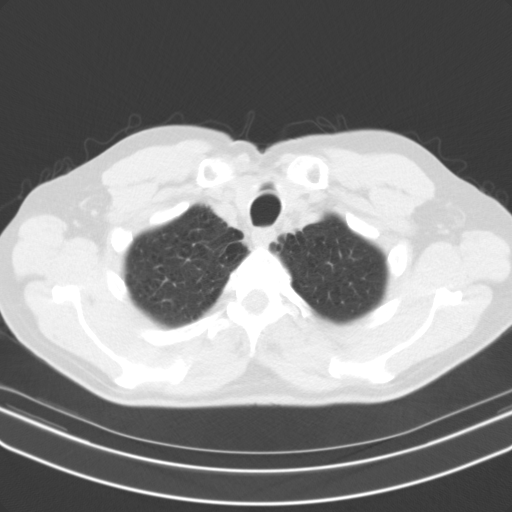
[im 60/65  lung]
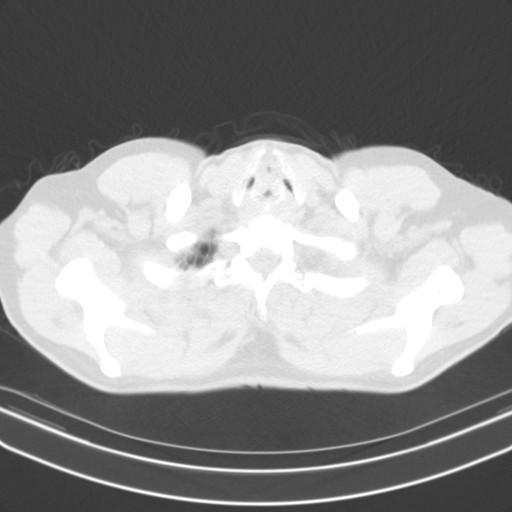

[14 of 33 positions shown; findings below may reference images not displayed]

FINDINGS: Cardiovascular: Heart size is normal. There is no significant
pericardial fluid, thickening or pericardial calcification. There is
aortic atherosclerosis, as well as atherosclerosis of the great
vessels of the mediastinum and the coronary arteries, including
calcified atherosclerotic plaque in the left main, left anterior
descending, left circumflex and right coronary arteries. Mild
calcifications of the aortic valve.

Mediastinum/Nodes: No pathologically enlarged mediastinal or hilar
lymph nodes. Please note that accurate exclusion of hilar adenopathy
is limited on noncontrast CT scans. Esophagus is unremarkable in
appearance. No axillary lymphadenopathy.

Lungs/Pleura: Multiple tiny pulmonary nodules are scattered
throughout the lungs, largest of which is in the periphery of the
right upper lobe (axial image 136 of series 3), with a volume
derived mean diameter of 3.3 mm. No larger more suspicious appearing
pulmonary nodules or masses are noted. No acute consolidative
airspace disease. No pleural effusions. Diffuse bronchial wall
thickening with moderate centrilobular and paraseptal emphysema.

Upper Abdomen: Aortic atherosclerosis.

Musculoskeletal: There are no aggressive appearing lytic or blastic
lesions noted in the visualized portions of the skeleton.
IMPRESSION: 1. Lung-RADS 2S, benign appearance or behavior. Continue annual
screening with low-dose chest CT without contrast in 12 months.
2. The "S" modifier above refers to potentially clinically
significant non lung cancer related findings. Specifically, there is
aortic atherosclerosis, in addition to left main and 3 vessel
coronary artery disease. Please note that although the presence of
coronary artery calcium documents the presence of coronary artery
disease, the severity of this disease and any potential stenosis
cannot be assessed on this non-gated CT examination. Assessment for
potential risk factor modification, dietary therapy or pharmacologic
therapy may be warranted, if clinically indicated.
3. Mild diffuse bronchial wall thickening with moderate
centrilobular and paraseptal emphysema; imaging findings suggestive
of underlying COPD.
4. There are calcifications of the aortic valve. Echocardiographic
correlation for evaluation of potential valvular dysfunction may be
warranted if clinically indicated.

Aortic Atherosclerosis (2EEF0-26T.T) and Emphysema (2EEF0-78R.1).

## 2022-11-28 ENCOUNTER — Other Ambulatory Visit: Payer: Self-pay | Admitting: Internal Medicine

## 2022-11-28 DIAGNOSIS — F17200 Nicotine dependence, unspecified, uncomplicated: Secondary | ICD-10-CM

## 2022-12-13 ENCOUNTER — Ambulatory Visit
Admission: RE | Admit: 2022-12-13 | Discharge: 2022-12-13 | Disposition: A | Discharge status: Home / Self Care | Payer: PPO | Source: Ambulatory Visit | Attending: Internal Medicine | Admitting: Internal Medicine

## 2022-12-13 DIAGNOSIS — F17200 Nicotine dependence, unspecified, uncomplicated: Secondary | ICD-10-CM

## 2023-04-18 DIAGNOSIS — D485 Neoplasm of uncertain behavior of skin: Secondary | ICD-10-CM | POA: Diagnosis not present

## 2023-04-18 DIAGNOSIS — D225 Melanocytic nevi of trunk: Secondary | ICD-10-CM | POA: Diagnosis not present

## 2023-04-18 DIAGNOSIS — L821 Other seborrheic keratosis: Secondary | ICD-10-CM | POA: Diagnosis not present

## 2023-04-18 DIAGNOSIS — Z859 Personal history of malignant neoplasm, unspecified: Secondary | ICD-10-CM | POA: Diagnosis not present

## 2023-05-23 DIAGNOSIS — L820 Inflamed seborrheic keratosis: Secondary | ICD-10-CM | POA: Diagnosis not present

## 2023-05-23 DIAGNOSIS — D485 Neoplasm of uncertain behavior of skin: Secondary | ICD-10-CM | POA: Diagnosis not present

## 2023-05-28 DIAGNOSIS — I6521 Occlusion and stenosis of right carotid artery: Secondary | ICD-10-CM | POA: Diagnosis not present

## 2023-05-28 DIAGNOSIS — I700 Atherosclerosis of aorta: Secondary | ICD-10-CM | POA: Diagnosis not present

## 2023-05-28 DIAGNOSIS — N182 Chronic kidney disease, stage 2 (mild): Secondary | ICD-10-CM | POA: Diagnosis not present

## 2023-05-28 DIAGNOSIS — E039 Hypothyroidism, unspecified: Secondary | ICD-10-CM | POA: Diagnosis not present

## 2023-05-28 DIAGNOSIS — I10 Essential (primary) hypertension: Secondary | ICD-10-CM | POA: Diagnosis not present

## 2023-05-28 DIAGNOSIS — D692 Other nonthrombocytopenic purpura: Secondary | ICD-10-CM | POA: Diagnosis not present

## 2023-05-28 DIAGNOSIS — E782 Mixed hyperlipidemia: Secondary | ICD-10-CM | POA: Diagnosis not present

## 2023-06-11 DIAGNOSIS — I700 Atherosclerosis of aorta: Secondary | ICD-10-CM | POA: Diagnosis not present

## 2023-06-11 DIAGNOSIS — N182 Chronic kidney disease, stage 2 (mild): Secondary | ICD-10-CM | POA: Diagnosis not present

## 2023-06-11 DIAGNOSIS — E782 Mixed hyperlipidemia: Secondary | ICD-10-CM | POA: Diagnosis not present

## 2023-06-11 DIAGNOSIS — J410 Simple chronic bronchitis: Secondary | ICD-10-CM | POA: Diagnosis not present

## 2023-06-11 DIAGNOSIS — I6521 Occlusion and stenosis of right carotid artery: Secondary | ICD-10-CM | POA: Diagnosis not present

## 2023-06-11 DIAGNOSIS — I10 Essential (primary) hypertension: Secondary | ICD-10-CM | POA: Diagnosis not present

## 2023-06-11 DIAGNOSIS — D692 Other nonthrombocytopenic purpura: Secondary | ICD-10-CM | POA: Diagnosis not present

## 2023-06-11 DIAGNOSIS — M5135 Other intervertebral disc degeneration, thoracolumbar region: Secondary | ICD-10-CM | POA: Diagnosis not present

## 2023-06-11 DIAGNOSIS — F17200 Nicotine dependence, unspecified, uncomplicated: Secondary | ICD-10-CM | POA: Diagnosis not present

## 2023-06-11 DIAGNOSIS — E039 Hypothyroidism, unspecified: Secondary | ICD-10-CM | POA: Diagnosis not present

## 2023-10-22 DIAGNOSIS — Z08 Encounter for follow-up examination after completed treatment for malignant neoplasm: Secondary | ICD-10-CM | POA: Diagnosis not present

## 2023-10-22 DIAGNOSIS — Z85828 Personal history of other malignant neoplasm of skin: Secondary | ICD-10-CM | POA: Diagnosis not present

## 2023-10-22 DIAGNOSIS — Z859 Personal history of malignant neoplasm, unspecified: Secondary | ICD-10-CM | POA: Diagnosis not present

## 2023-10-22 DIAGNOSIS — D225 Melanocytic nevi of trunk: Secondary | ICD-10-CM | POA: Diagnosis not present

## 2023-10-22 DIAGNOSIS — L820 Inflamed seborrheic keratosis: Secondary | ICD-10-CM | POA: Diagnosis not present

## 2023-10-22 DIAGNOSIS — Z129 Encounter for screening for malignant neoplasm, site unspecified: Secondary | ICD-10-CM | POA: Diagnosis not present

## 2023-11-26 DIAGNOSIS — I10 Essential (primary) hypertension: Secondary | ICD-10-CM | POA: Diagnosis not present

## 2023-11-26 DIAGNOSIS — F17200 Nicotine dependence, unspecified, uncomplicated: Secondary | ICD-10-CM | POA: Diagnosis not present

## 2023-11-26 DIAGNOSIS — E782 Mixed hyperlipidemia: Secondary | ICD-10-CM | POA: Diagnosis not present

## 2023-11-26 DIAGNOSIS — I700 Atherosclerosis of aorta: Secondary | ICD-10-CM | POA: Diagnosis not present

## 2023-11-26 DIAGNOSIS — M5135 Other intervertebral disc degeneration, thoracolumbar region: Secondary | ICD-10-CM | POA: Diagnosis not present

## 2023-11-26 DIAGNOSIS — I6521 Occlusion and stenosis of right carotid artery: Secondary | ICD-10-CM | POA: Diagnosis not present

## 2023-11-26 DIAGNOSIS — E039 Hypothyroidism, unspecified: Secondary | ICD-10-CM | POA: Diagnosis not present

## 2023-11-26 DIAGNOSIS — D692 Other nonthrombocytopenic purpura: Secondary | ICD-10-CM | POA: Diagnosis not present

## 2023-11-26 DIAGNOSIS — N182 Chronic kidney disease, stage 2 (mild): Secondary | ICD-10-CM | POA: Diagnosis not present

## 2023-11-26 DIAGNOSIS — J410 Simple chronic bronchitis: Secondary | ICD-10-CM | POA: Diagnosis not present

## 2023-11-26 DIAGNOSIS — Z125 Encounter for screening for malignant neoplasm of prostate: Secondary | ICD-10-CM | POA: Diagnosis not present

## 2023-12-03 DIAGNOSIS — J410 Simple chronic bronchitis: Secondary | ICD-10-CM | POA: Diagnosis not present

## 2023-12-03 DIAGNOSIS — I10 Essential (primary) hypertension: Secondary | ICD-10-CM | POA: Diagnosis not present

## 2023-12-03 DIAGNOSIS — N182 Chronic kidney disease, stage 2 (mild): Secondary | ICD-10-CM | POA: Diagnosis not present

## 2023-12-03 DIAGNOSIS — E039 Hypothyroidism, unspecified: Secondary | ICD-10-CM | POA: Diagnosis not present

## 2023-12-03 DIAGNOSIS — E782 Mixed hyperlipidemia: Secondary | ICD-10-CM | POA: Diagnosis not present

## 2023-12-03 DIAGNOSIS — D692 Other nonthrombocytopenic purpura: Secondary | ICD-10-CM | POA: Diagnosis not present

## 2023-12-03 DIAGNOSIS — M5135 Other intervertebral disc degeneration, thoracolumbar region: Secondary | ICD-10-CM | POA: Diagnosis not present

## 2023-12-03 DIAGNOSIS — Z0000 Encounter for general adult medical examination without abnormal findings: Secondary | ICD-10-CM | POA: Diagnosis not present

## 2023-12-03 DIAGNOSIS — I6521 Occlusion and stenosis of right carotid artery: Secondary | ICD-10-CM | POA: Diagnosis not present

## 2023-12-23 ENCOUNTER — Other Ambulatory Visit: Payer: Self-pay | Admitting: Internal Medicine

## 2023-12-23 DIAGNOSIS — F17200 Nicotine dependence, unspecified, uncomplicated: Secondary | ICD-10-CM

## 2024-01-06 ENCOUNTER — Ambulatory Visit
Admission: RE | Admit: 2024-01-06 | Discharge: 2024-01-06 | Disposition: A | Source: Ambulatory Visit | Attending: Internal Medicine | Admitting: Internal Medicine

## 2024-01-06 DIAGNOSIS — Z87891 Personal history of nicotine dependence: Secondary | ICD-10-CM | POA: Diagnosis not present

## 2024-01-06 DIAGNOSIS — F17200 Nicotine dependence, unspecified, uncomplicated: Secondary | ICD-10-CM
